# Patient Record
Sex: Male | Born: 1985 | Race: Black or African American | Hispanic: No | Marital: Single
Health system: Southern US, Community
[De-identification: ages and names within clinical notes are randomized; demographics above are authoritative.]

## PROBLEM LIST (undated history)

## (undated) DIAGNOSIS — I639 Cerebral infarction, unspecified: Secondary | ICD-10-CM

## (undated) HISTORY — PX: ABDOMINAL SURGERY: SHX537

## (undated) HISTORY — PX: CHEST SURGERY: SHX595

---
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
## 2006-09-06 IMAGING — CR DG CHEST 2V
2 series · 2 of 2 positions shown · non-contrast
Comparison: None.

CLINICAL DATA: Cough and fever. Body aches.  Hemoptysis.  

 CHEST (TWO VIEWS):

[w chest pa]
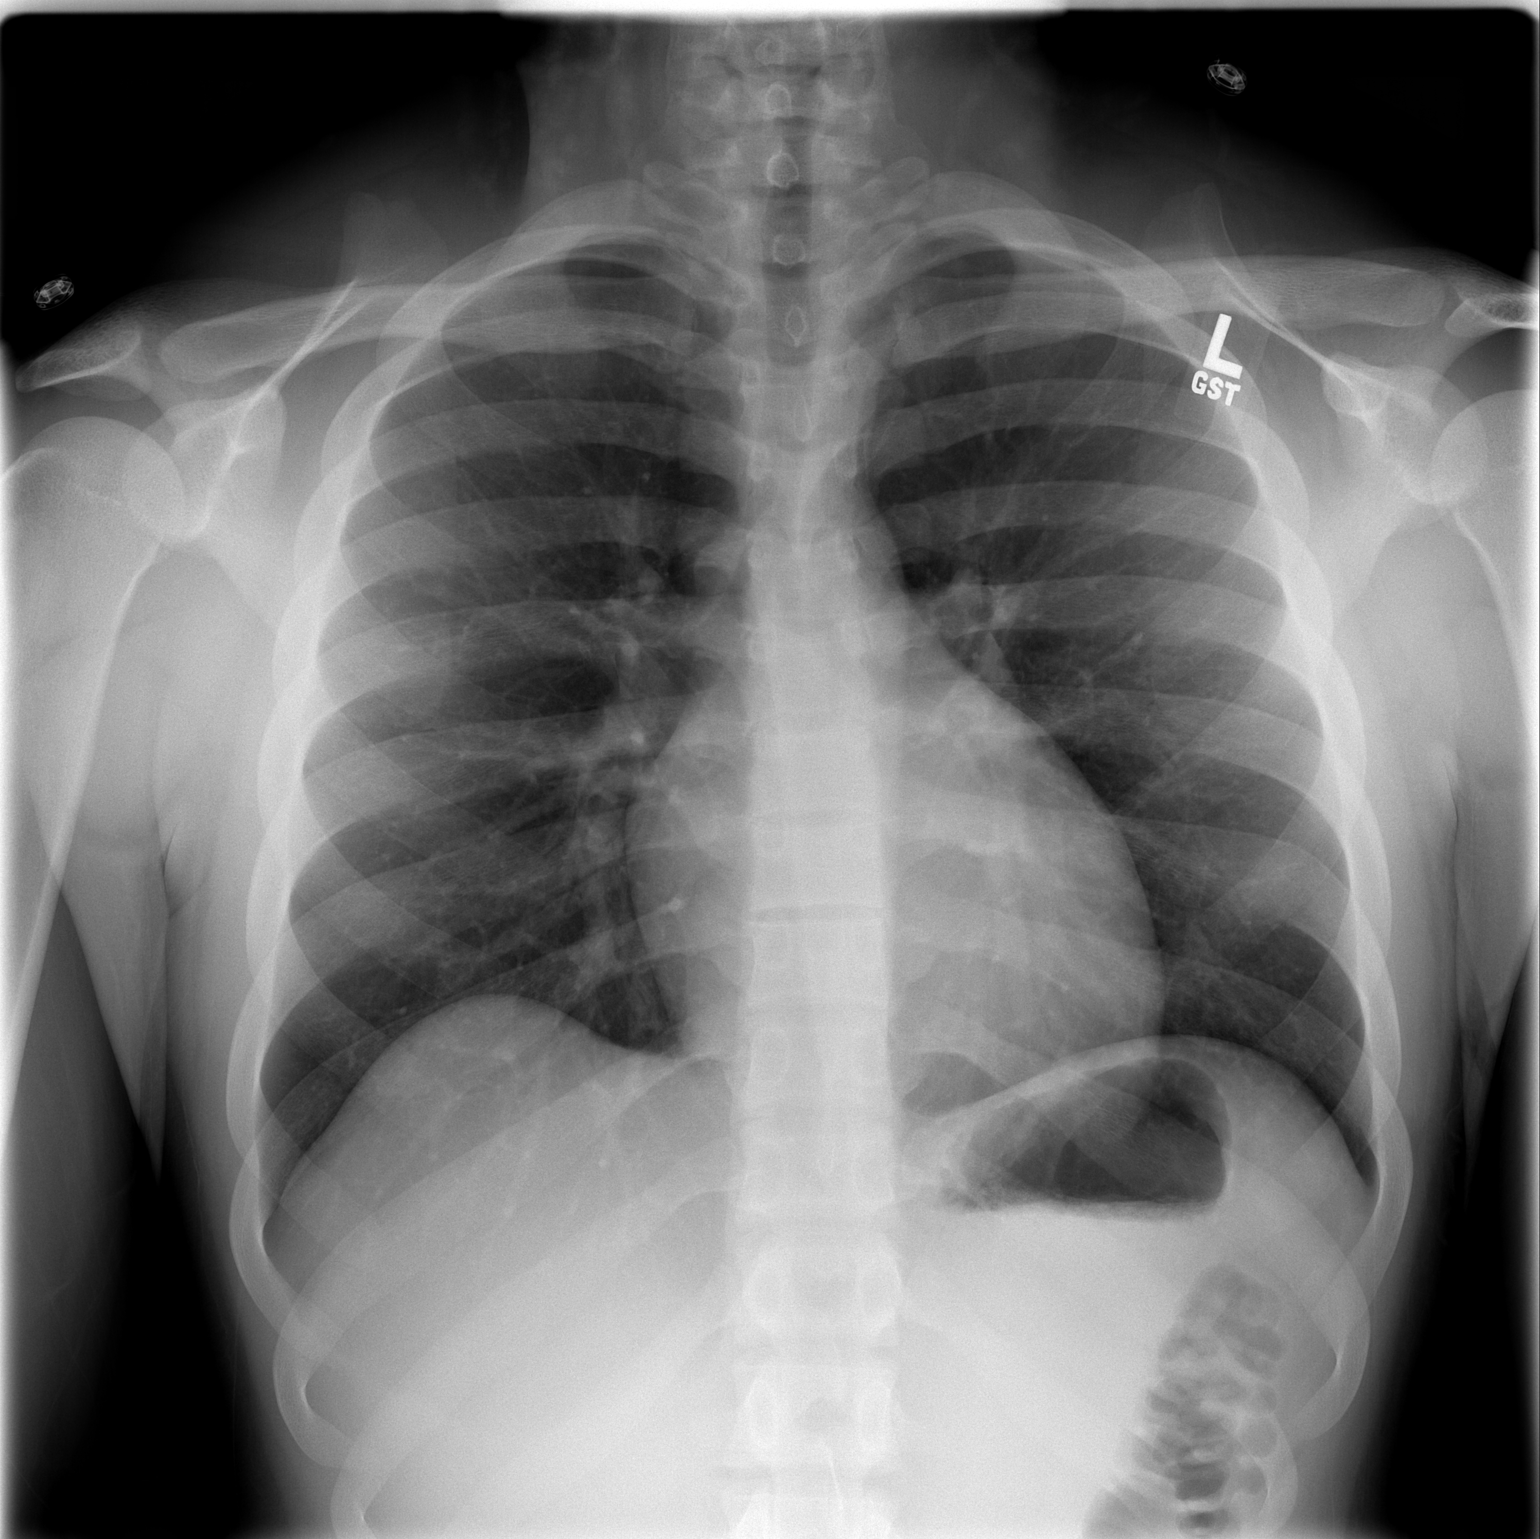

[w chest lat]
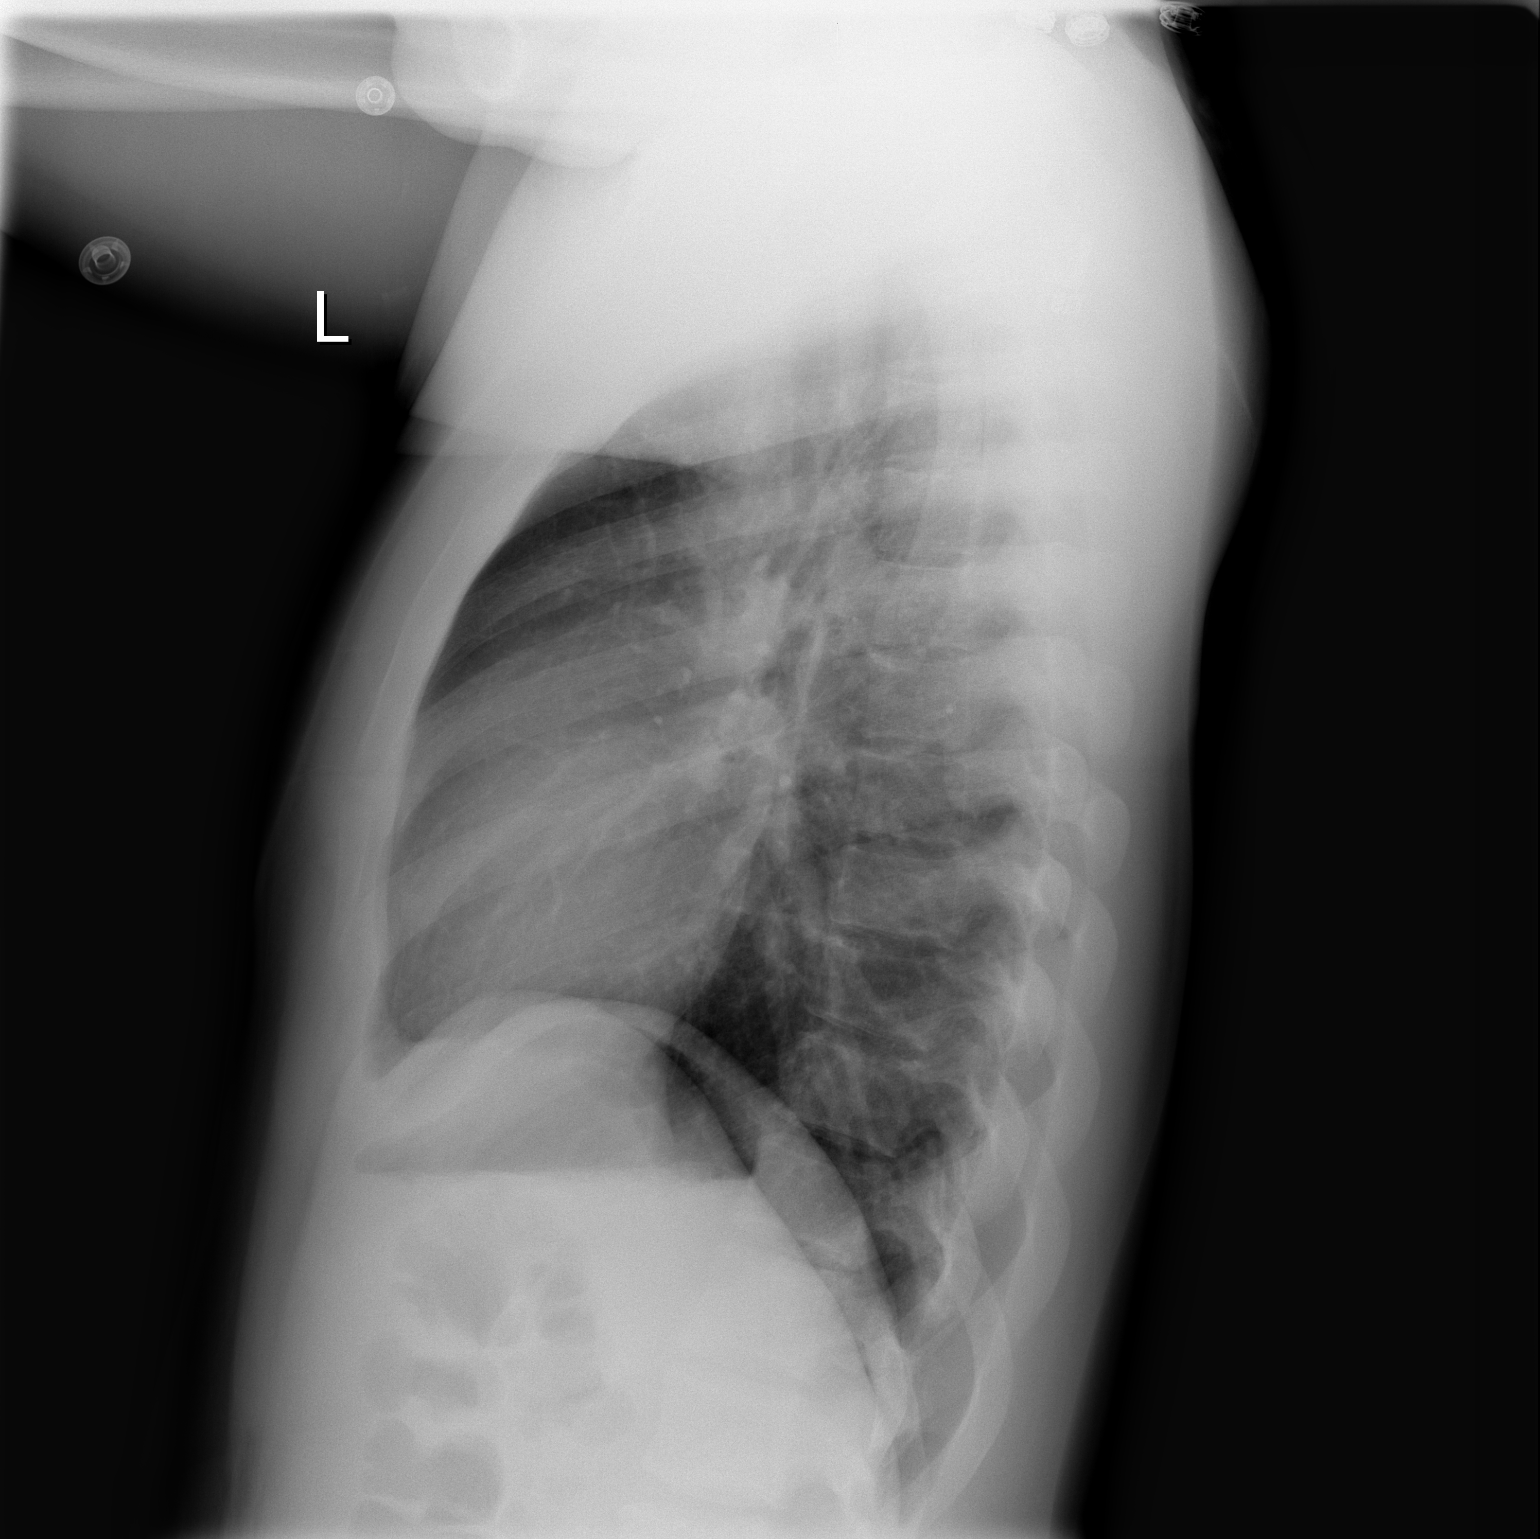

[2 of 2 positions shown; findings below may reference images not displayed]

The heart size and mediastinal contours are normal. The lungs are clear. The visualized skeleton is unremarkable.
IMPRESSION: No active disease.

## 2006-09-16 ENCOUNTER — Emergency Department (HOSPITAL_COMMUNITY): Admission: EM | Admit: 2006-09-16 | Discharge: 2006-09-17 | Payer: Self-pay | Admitting: Emergency Medicine

## 2017-10-21 ENCOUNTER — Encounter (HOSPITAL_BASED_OUTPATIENT_CLINIC_OR_DEPARTMENT_OTHER): Payer: Self-pay

## 2017-10-21 ENCOUNTER — Emergency Department (HOSPITAL_BASED_OUTPATIENT_CLINIC_OR_DEPARTMENT_OTHER)
Admission: EM | Admit: 2017-10-21 | Discharge: 2017-10-21 | Disposition: A | Discharge status: Home / Self Care | Payer: Self-pay | Attending: Emergency Medicine | Admitting: Emergency Medicine

## 2017-10-21 DIAGNOSIS — F17200 Nicotine dependence, unspecified, uncomplicated: Secondary | ICD-10-CM | POA: Insufficient documentation

## 2017-10-21 DIAGNOSIS — Z202 Contact with and (suspected) exposure to infections with a predominantly sexual mode of transmission: Secondary | ICD-10-CM | POA: Insufficient documentation

## 2017-10-21 LAB — URINALYSIS, ROUTINE W REFLEX MICROSCOPIC
Bilirubin Urine: NEGATIVE
Glucose, UA: NEGATIVE mg/dL
Ketones, ur: NEGATIVE mg/dL
Leukocytes, UA: NEGATIVE
Nitrite: NEGATIVE
Protein, ur: NEGATIVE mg/dL
Specific Gravity, Urine: 1.01 (ref 1.005–1.030)
pH: 6 (ref 5.0–8.0)

## 2017-10-21 LAB — URINALYSIS, MICROSCOPIC (REFLEX)

## 2017-10-21 MED ORDER — AZITHROMYCIN 250 MG PO TABS
1000.0000 mg | ORAL_TABLET | Freq: Once | ORAL | Status: AC
  Administered 2017-10-21: 1000 mg via ORAL
  Filled 2017-10-21: qty 4

## 2017-10-21 MED ORDER — AZITHROMYCIN 250 MG PO TABS: 250.0000 mg | ORAL_TABLET | Freq: Every day | ORAL | 0 refills | Status: DC

## 2017-10-21 MED ORDER — CEFTRIAXONE SODIUM 250 MG IJ SOLR
250.0000 mg | Freq: Once | INTRAMUSCULAR | Status: AC
  Administered 2017-10-21: 250 mg via INTRAMUSCULAR
  Filled 2017-10-21: qty 250

## 2017-10-21 MED FILL — AZITHROMYCIN 250 MG TABLET: 250 | 5 days supply | Qty: 6 | Fill #0
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
## 2019-08-26 ENCOUNTER — Emergency Department (HOSPITAL_BASED_OUTPATIENT_CLINIC_OR_DEPARTMENT_OTHER)
Admission: EM | Admit: 2019-08-26 | Discharge: 2019-08-26 | Disposition: A | Discharge status: Home / Self Care | Payer: HRSA Program | Attending: Emergency Medicine | Admitting: Emergency Medicine

## 2019-08-26 ENCOUNTER — Other Ambulatory Visit: Payer: Self-pay

## 2019-08-26 ENCOUNTER — Encounter (HOSPITAL_BASED_OUTPATIENT_CLINIC_OR_DEPARTMENT_OTHER): Payer: Self-pay | Admitting: Emergency Medicine

## 2019-08-26 DIAGNOSIS — Z8673 Personal history of transient ischemic attack (TIA), and cerebral infarction without residual deficits: Secondary | ICD-10-CM | POA: Insufficient documentation

## 2019-08-26 DIAGNOSIS — R059 Cough, unspecified: Secondary | ICD-10-CM

## 2019-08-26 DIAGNOSIS — R05 Cough: Secondary | ICD-10-CM | POA: Insufficient documentation

## 2019-08-26 DIAGNOSIS — Z20828 Contact with and (suspected) exposure to other viral communicable diseases: Secondary | ICD-10-CM | POA: Insufficient documentation

## 2019-08-26 DIAGNOSIS — J3489 Other specified disorders of nose and nasal sinuses: Secondary | ICD-10-CM | POA: Insufficient documentation

## 2019-08-26 HISTORY — DX: Cerebral infarction, unspecified: I63.9

## 2019-08-26 NOTE — ED Notes (Signed)
ED Provider at bedside. 

## 2019-08-26 NOTE — Discharge Instructions (Signed)
It was my pleasure taking care of you today!   Rest, drink plenty of fluids to be sure you are staying hydrated.   Please follow up with your primary doctor for discussion of your diagnoses and further evaluation after today's visit if symptoms persist longer than 7 days; Return to the ER for high fevers, difficulty breathing or other concerning symptoms

## 2019-08-26 NOTE — ED Triage Notes (Signed)
Cough and runny nose since yesterday.  Sts he felt hot last night but did not check temp.
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
## 2019-08-27 LAB — NOVEL CORONAVIRUS, NAA (HOSP ORDER, SEND-OUT TO REF LAB; TAT 18-24 HRS): SARS-CoV-2, NAA: NOT DETECTED

## 2023-01-25 ENCOUNTER — Other Ambulatory Visit: Payer: Self-pay

## 2023-01-25 ENCOUNTER — Encounter (HOSPITAL_COMMUNITY): Payer: Self-pay | Admitting: Emergency Medicine

## 2023-01-25 ENCOUNTER — Emergency Department (HOSPITAL_COMMUNITY)
Admission: EM | Admit: 2023-01-25 | Discharge: 2023-01-26 | Disposition: A | Discharge status: Home / Self Care | Payer: Medicaid Other | Attending: Emergency Medicine | Admitting: Emergency Medicine

## 2023-01-25 ENCOUNTER — Emergency Department (HOSPITAL_COMMUNITY): Payer: Medicaid Other

## 2023-01-25 DIAGNOSIS — S01111A Laceration without foreign body of right eyelid and periocular area, initial encounter: Secondary | ICD-10-CM

## 2023-01-25 DIAGNOSIS — S00201A Unspecified superficial injury of right eyelid and periocular area, initial encounter: Secondary | ICD-10-CM | POA: Diagnosis present

## 2023-01-25 DIAGNOSIS — X58XXXA Exposure to other specified factors, initial encounter: Secondary | ICD-10-CM | POA: Insufficient documentation

## 2023-01-25 DIAGNOSIS — Z79899 Other long term (current) drug therapy: Secondary | ICD-10-CM | POA: Insufficient documentation

## 2023-01-25 DIAGNOSIS — S01131A Puncture wound without foreign body of right eyelid and periocular area, initial encounter: Secondary | ICD-10-CM | POA: Insufficient documentation

## 2023-01-25 LAB — URINALYSIS, MICROSCOPIC (REFLEX): Bacteria, UA: NONE SEEN

## 2023-01-25 LAB — URINALYSIS, ROUTINE W REFLEX MICROSCOPIC
Bilirubin Urine: NEGATIVE
Glucose, UA: NEGATIVE mg/dL
Ketones, ur: NEGATIVE mg/dL
Leukocytes,Ua: NEGATIVE
Nitrite: NEGATIVE
Protein, ur: NEGATIVE mg/dL
Specific Gravity, Urine: >1.03 — ABNORMAL HIGH (ref 1.005–1.030)
pH: 5.5 (ref 5.0–8.0)

## 2023-01-25 LAB — I-STAT CHEM 8, ED
BUN: 18 mg/dL (ref 6–20)
Calcium, Ion: 1.03 mmol/L — ABNORMAL LOW (ref 1.15–1.40)
Chloride: 109 mmol/L (ref 98–111)
Creatinine, Ser: 1.5 mg/dL — ABNORMAL HIGH (ref 0.61–1.24)
Glucose, Bld: 91 mg/dL (ref 70–99)
HCT: 48 % (ref 39.0–52.0)
Hemoglobin: 16.3 g/dL (ref 13.0–17.0)
Potassium: 4.2 mmol/L (ref 3.5–5.1)
Sodium: 144 mmol/L (ref 135–145)
TCO2: 24 mmol/L (ref 22–32)

## 2023-01-25 LAB — COMPREHENSIVE METABOLIC PANEL
ALT: 19 U/L (ref 0–44)
AST: 38 U/L (ref 15–41)
Albumin: 3.7 g/dL (ref 3.5–5.0)
Alkaline Phosphatase: 60 U/L (ref 38–126)
Anion gap: 11 (ref 5–15)
BUN: 14 mg/dL (ref 6–20)
CO2: 22 mmol/L (ref 22–32)
Calcium: 8.6 mg/dL — ABNORMAL LOW (ref 8.9–10.3)
Chloride: 108 mmol/L (ref 98–111)
Creatinine, Ser: 1.24 mg/dL (ref 0.61–1.24)
GFR, Estimated: >60 mL/min (ref >60)
Glucose, Bld: 92 mg/dL (ref 70–99)
Potassium: 4.1 mmol/L (ref 3.5–5.1)
Sodium: 141 mmol/L (ref 135–145)
Total Bilirubin: 0.8 mg/dL (ref 0.3–1.2)
Total Protein: 6.5 g/dL (ref 6.5–8.1)

## 2023-01-25 LAB — CBC
HCT: 46.3 % (ref 39.0–52.0)
Hemoglobin: 15.7 g/dL (ref 13.0–17.0)
MCH: 30.8 pg (ref 26.0–34.0)
MCHC: 33.9 g/dL (ref 30.0–36.0)
MCV: 91 fL (ref 80.0–100.0)
Platelets: 222 10*3/uL (ref 150–400)
RBC: 5.09 MIL/uL (ref 4.22–5.81)
RDW: 14.5 % (ref 11.5–15.5)
WBC: 9.5 10*3/uL (ref 4.0–10.5)
nRBC: 0 % (ref 0.0–0.2)

## 2023-01-25 LAB — SAMPLE TO BLOOD BANK

## 2023-01-25 LAB — PROTIME-INR
INR: 1 (ref 0.8–1.2)
Prothrombin Time: 12.7 seconds (ref 11.4–15.2)

## 2023-01-25 LAB — ETHANOL: Alcohol, Ethyl (B): 194 mg/dL — ABNORMAL HIGH (ref <10)

## 2023-01-25 LAB — LACTIC ACID, PLASMA: Lactic Acid, Venous: 1.9 mmol/L (ref 0.5–1.9)

## 2023-01-25 MED ORDER — LIDOCAINE-EPINEPHRINE (PF) 2 %-1:200000 IJ SOLN
INTRAMUSCULAR | Status: AC
  Filled 2023-01-25: qty 20

## 2023-01-25 MED ORDER — PIPERACILLIN-TAZOBACTAM 3.375 G IVPB 30 MIN
3.3750 g | Freq: Once | INTRAVENOUS | Status: AC
  Administered 2023-01-25: 3.375 g via INTRAVENOUS

## 2023-01-25 MED ORDER — LORAZEPAM 2 MG/ML IJ SOLN
1.0000 mg | Freq: Once | INTRAMUSCULAR | Status: AC
  Administered 2023-01-25: 1 mg via INTRAVENOUS
  Filled 2023-01-25: qty 1
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
## 2023-01-25 NOTE — Progress Notes (Signed)
Orthopedic Tech Progress Note Patient Details:  Randall Stewart 03/09/1986 [id number]  Patient ID: Randall Stewart, male   DOB: [birth year], 37 y.o.   MRN: [id number] I attended trauma page. Karolee Stamps 01/25/2023, 9:42 PM
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
## 2023-01-25 NOTE — ED Notes (Signed)
Ophthalmology at bedside to attempt closure of R eye. Patient is uncooperative at this time and is thrashing around in bed while MD is trying to do eye exam.
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
## 2023-01-25 NOTE — ED Notes (Signed)
Ophthalmology MD at bedside to evaluate patient

## 2023-01-25 NOTE — ED Notes (Signed)
Verbal order from [place], MD to give other half of Ativan

## 2023-01-26 MED ORDER — CEPHALEXIN 500 MG PO CAPS
500.0000 mg | ORAL_CAPSULE | Freq: Four times a day (QID) | ORAL | 0 refills | Status: DC
  Filled 2023-01-26: qty 20, 5d supply, fill #0

## 2023-01-26 MED ORDER — CEPHALEXIN 500 MG PO CAPS: 500.0000 mg | ORAL_CAPSULE | Freq: Four times a day (QID) | ORAL | 0 refills | Status: AC

## 2023-01-26 MED ORDER — HYDROCODONE-ACETAMINOPHEN 5-325 MG PO TABS: 1.0000 | ORAL_TABLET | Freq: Four times a day (QID) | ORAL | 0 refills | Status: AC | PRN

## 2023-01-26 MED ORDER — HYDROCODONE-ACETAMINOPHEN 5-325 MG PO TABS
1.0000 | ORAL_TABLET | Freq: Once | ORAL | Status: AC
  Administered 2023-01-26: 1 via ORAL
  Filled 2023-01-26: qty 1

## 2023-01-26 MED ORDER — ERYTHROMYCIN 5 MG/GM OP OINT
1.0000 | TOPICAL_OINTMENT | Freq: Three times a day (TID) | OPHTHALMIC | Status: DC
  Administered 2023-01-26: 1 via OPHTHALMIC
  Filled 2023-01-26: qty 3.5
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
## 2023-01-27 ENCOUNTER — Other Ambulatory Visit: Payer: Self-pay

## 2023-02-08 ENCOUNTER — Other Ambulatory Visit: Payer: Self-pay

## 2023-02-08 ENCOUNTER — Encounter (HOSPITAL_BASED_OUTPATIENT_CLINIC_OR_DEPARTMENT_OTHER): Payer: Self-pay | Admitting: Emergency Medicine

## 2023-02-08 ENCOUNTER — Emergency Department (HOSPITAL_BASED_OUTPATIENT_CLINIC_OR_DEPARTMENT_OTHER)
Admission: EM | Admit: 2023-02-08 | Discharge: 2023-02-08 | Discharge status: Against Medical Advice | Payer: Medicaid Other | Attending: Emergency Medicine | Admitting: Emergency Medicine

## 2023-02-08 DIAGNOSIS — K649 Unspecified hemorrhoids: Secondary | ICD-10-CM | POA: Diagnosis present

## 2023-02-08 DIAGNOSIS — Z5321 Procedure and treatment not carried out due to patient leaving prior to being seen by health care provider: Secondary | ICD-10-CM | POA: Diagnosis not present

## 2023-02-08 NOTE — ED Triage Notes (Signed)
Pt c/o rectal pain; sts possibly hemorrhoids

## 2023-02-08 NOTE — ED Provider Notes (Signed)
Patient has eloped.   Varney Biles, MD 02/08/23 2223

## 2023-02-08 NOTE — ED Provider Triage Note (Signed)
Emergency Medicine Provider Triage Evaluation Note  Randall Stewart , a 37 y.o. male  was evaluated in triage.  Pt complains of rectal pain for the past few days, but worse last night. He reports that he couldn't go to sleep last night because of the pain. Reports that he feels like it is "inside". No black of bloody stools. Does not think he is constipated. No medication trialed.  Review of Systems  Positive:  Negative:   Physical Exam  BP 104/72   Pulse 82   Temp (!) 97.5 F (36.4 C) (Oral)   Resp 16   SpO2 97%  Gen:   Awake, no distress   Resp:  Normal effort  MSK:   Moves extremities without difficulty  Other:    Medical Decision Making  Medically screening exam initiated at 3:29 PM.  Appropriate orders placed.  Randall Stewart was informed that the remainder of the evaluation will be completed by another provider, this initial triage assessment does not replace that evaluation, and the importance of remaining in the ED until their evaluation is complete.     Sherrell Puller, [place] 02/08/23 1530

## 2023-09-12 ENCOUNTER — Encounter (HOSPITAL_COMMUNITY): Payer: Self-pay | Admitting: Emergency Medicine

## 2023-09-12 ENCOUNTER — Ambulatory Visit (HOSPITAL_COMMUNITY)
Admission: EM | Admit: 2023-09-12 | Discharge: 2023-09-12 | Disposition: A | Discharge status: Home / Self Care | Payer: 59 | Attending: Emergency Medicine | Admitting: Emergency Medicine

## 2023-09-12 DIAGNOSIS — B353 Tinea pedis: Secondary | ICD-10-CM | POA: Diagnosis not present

## 2023-09-12 DIAGNOSIS — B351 Tinea unguium: Secondary | ICD-10-CM

## 2023-09-12 DIAGNOSIS — R609 Edema, unspecified: Secondary | ICD-10-CM | POA: Diagnosis not present

## 2023-09-12 MED ORDER — EFINACONAZOLE 10 % EX SOLN: 1.0000 | Freq: Every day | CUTANEOUS | 10 refills | Status: AC

## 2023-09-12 MED ORDER — CLOTRIMAZOLE 1 % EX CREA: TOPICAL_CREAM | CUTANEOUS | 0 refills | Status: AC
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
## 2023-09-12 NOTE — ED Triage Notes (Signed)
Pt c/o of both feet swelling and redness that he noticed last night. States there is no pain.

## 2023-09-12 NOTE — Discharge Instructions (Signed)
For your edema of your ankles you can wear compression socks daily to help with your venous return.  Please elevate your feet when at rest and at home.  You have a fungal infection between your toes and on your toenails.  Apply the topical efinaconazole solution daily until resolution, this can take up to 48 weeks.  Use the Lotrimin cream in between your toes twice daily to help with the fungal infection there as well.  You can use cotton socks and change them frequently or no socks.  Return to clinic for any new or urgent symptoms.

## 2023-10-07 ENCOUNTER — Emergency Department (HOSPITAL_COMMUNITY)
Admission: EM | Admit: 2023-10-07 | Discharge: 2023-10-08 | Disposition: A | Discharge status: Home / Self Care | Payer: 59 | Attending: Emergency Medicine | Admitting: Emergency Medicine

## 2023-10-07 ENCOUNTER — Emergency Department (HOSPITAL_COMMUNITY): Payer: 59

## 2023-10-07 ENCOUNTER — Other Ambulatory Visit: Payer: Self-pay

## 2023-10-07 DIAGNOSIS — W228XXA Striking against or struck by other objects, initial encounter: Secondary | ICD-10-CM | POA: Insufficient documentation

## 2023-10-07 DIAGNOSIS — Y9301 Activity, walking, marching and hiking: Secondary | ICD-10-CM | POA: Insufficient documentation

## 2023-10-07 DIAGNOSIS — Y990 Civilian activity done for income or pay: Secondary | ICD-10-CM | POA: Diagnosis not present

## 2023-10-07 DIAGNOSIS — S0003XA Contusion of scalp, initial encounter: Secondary | ICD-10-CM | POA: Diagnosis not present

## 2023-10-07 DIAGNOSIS — I517 Cardiomegaly: Secondary | ICD-10-CM | POA: Diagnosis not present

## 2023-10-07 DIAGNOSIS — Z7982 Long term (current) use of aspirin: Secondary | ICD-10-CM | POA: Insufficient documentation

## 2023-10-07 DIAGNOSIS — R051 Acute cough: Secondary | ICD-10-CM | POA: Diagnosis not present

## 2023-10-07 DIAGNOSIS — R059 Cough, unspecified: Secondary | ICD-10-CM | POA: Diagnosis not present

## 2023-10-07 DIAGNOSIS — S0001XA Abrasion of scalp, initial encounter: Secondary | ICD-10-CM | POA: Diagnosis not present

## 2023-10-07 DIAGNOSIS — S0990XA Unspecified injury of head, initial encounter: Secondary | ICD-10-CM | POA: Diagnosis not present

## 2023-10-07 MED ORDER — ACETAMINOPHEN 325 MG PO TABS
650.0000 mg | ORAL_TABLET | Freq: Once | ORAL | Status: AC
  Administered 2023-10-07: 650 mg via ORAL
  Filled 2023-10-07: qty 2

## 2023-10-07 NOTE — ED Triage Notes (Addendum)
Pt to ED POV from home. Pt states he was changing the breaks on his car yesterday and when he stood up, he hit the top of his head on the back of his car. Pt has abrasion to top of head. Pt denies any blood thinners other than baby aspirin. Pt denies any LOC. Pt endorses headache. Pt c/o mild dizziness while walking. Pt also c/o cough x2 weeks. Pt denies any pain or any fevers from cough. Pt has hx of stroke in 2019 and c/o no new deficits from hitting top of his head on car.

## 2023-10-08 NOTE — ED Provider Notes (Signed)
[place] [facility] AT [facility] Provider Note  CSN: [id number] Arrival date & time: 10/07/23 1839  Chief Complaint(s) Head Injury  HPI Randall Stewart is a 37 y.o. male here for frontal head injury that occurred yesterday.  Patient reports that he was working on a car when he stood up and scraped his scalp.  He has been having scalp pain since.  Patient has an abrasion to the frontal scalp region.  States that he cleaned it.  No focal deficits.  No visual disturbance.  No nausea or vomiting.  Patient does report cough for the past 2 weeks.Marland Kitchen  No chest pain.  No shortness of breath.  The history is provided by the patient.    Past Medical History Past Medical History:  Diagnosis Date   Stroke [facility])    There are no problems to display for this patient.  Home Medication(s) Prior to Admission medications   Medication Sig Start Date End Date Taking? Authorizing Provider  aspirin 81 MG chewable tablet  08/01/19   [provider]  cephALEXin (KEFLEX) 500 MG capsule Take 1 capsule (500 mg total) by mouth 4 (four) times daily. Patient not taking: Reported on 09/12/2023 01/26/23   Zadie Rhine, MD  clotrimazole (LOTRIMIN) 1 % cream Apply to affected area 2 times daily 09/12/23   Garrison, [place] N, FNP  Efinaconazole 10 % SOLN Apply 1 Application topically daily. 09/12/23   Garrison, [place] N, FNP  HYDROcodone-acetaminophen (NORCO/VICODIN) 5-325 MG tablet Take 1 tablet by mouth every 6 (six) hours as needed for severe pain. Patient not taking: Reported on 09/12/2023 01/26/23   Zadie Rhine, MD  traMADol Janean Sark) 50 MG tablet  04/15/19   [provider]                                                                                                                                    Allergies Patient has no known allergies.  Review of Systems Review of Systems As noted in HPI  Physical Exam Vital Signs  I have reviewed the triage vital  signs BP (!) 126/93   Pulse (!) 54   Temp 98 F (36.7 C) (Oral)   Resp 18   SpO2 98%   Physical Exam Vitals reviewed.  Constitutional:      General: He is not in acute distress.    Appearance: He is well-developed. He is not diaphoretic.  HENT:     Head: Normocephalic. Abrasion present.      Comments: Right eye ptosis (baseline from prior trauma)    Right Ear: External ear normal.     Left Ear: External ear normal.     Nose: Nose normal.     Mouth/Throat:     Mouth: Mucous membranes are moist.  Eyes:     General: No scleral icterus.    Conjunctiva/sclera: Conjunctivae normal.  Neck:     Trachea: Phonation normal.  Cardiovascular:  Rate and Rhythm: Normal rate and regular rhythm.  Pulmonary:     Effort: Pulmonary effort is normal. No respiratory distress.     Breath sounds: No stridor.  Abdominal:     General: There is no distension.  Musculoskeletal:        General: Normal range of motion.     Cervical back: Normal range of motion.  Neurological:     Mental Status: He is alert and oriented to person, place, and time.  Psychiatric:        Behavior: Behavior normal.     ED Results and Treatments Labs (all labs ordered are listed, but only abnormal results are displayed) Labs Reviewed - No data to display                                                                                                                       EKG  EKG Interpretation Date/Time:    Ventricular Rate:    PR Interval:    QRS Duration:    QT Interval:    QTC Calculation:   R Axis:      Text Interpretation:         Radiology DG Chest 2 View  Result Date: 10/08/2023 CLINICAL DATA:  Cough EXAM: CHEST - 2 VIEW COMPARISON:  02/13/2005, 11/26/2018 FINDINGS: Sternotomy changes. Borderline cardiac enlargement. No acute airspace disease, pleural effusion, or pneumothorax. IMPRESSION: No active cardiopulmonary disease. Borderline cardiac enlargement. Electronically Signed   By: Jasmine Pang M.D.   On: 10/08/2023 00:08    Medications Ordered in ED Medications  acetaminophen (TYLENOL) tablet 650 mg (650 mg Oral Given 10/07/23 2204)   Procedures Procedures  (including critical care time) Medical Decision Making / ED Course   Medical Decision Making Amount and/or Complexity of Data Reviewed Radiology: ordered and independent interpretation performed. Decision-making details documented in ED Course.    Minor head injury more than 24 hours ago.  Patient has frontal scalp abrasion that is well-appearing.  No evidence of superimposed infection.  Doubt ICH requiring imaging at this time. Chest x-ray negative for pneumonia, pneumothorax, pulmonary edema pleural effusion.  Cough likely seasonal allergies versus bronchitis.    Final Clinical Impression(s) / ED Diagnoses Final diagnoses:  Contusion of scalp, initial encounter  Abrasion of scalp, initial encounter  Acute cough   The patient appears reasonably screened and/or stabilized for discharge and I doubt any other medical condition or other [facility] requiring further screening, evaluation, or treatment in the ED at this time. I have discussed the findings, Dx and Tx plan with the patient/family who expressed understanding and agree(s) with the plan. Discharge instructions discussed at length. The patient/family was given strict return precautions who verbalized understanding of the instructions. No further questions at time of discharge.  Disposition: Discharge  Condition: Good  ED Discharge Orders     None       Follow Up: Primary care provider  Call  to schedule an appointment for close follow up  This chart was dictated using voice recognition software.  Despite best efforts to proofread,  errors can occur which can change the documentation meaning.    Nira Conn, MD 10/08/23 [phone]

## 2024-03-10 ENCOUNTER — Encounter (HOSPITAL_BASED_OUTPATIENT_CLINIC_OR_DEPARTMENT_OTHER): Payer: Self-pay

## 2024-03-10 ENCOUNTER — Other Ambulatory Visit: Payer: Self-pay

## 2024-03-10 ENCOUNTER — Emergency Department (HOSPITAL_BASED_OUTPATIENT_CLINIC_OR_DEPARTMENT_OTHER)
Admission: EM | Admit: 2024-03-10 | Discharge: 2024-03-10 | Disposition: A | Discharge status: Home / Self Care | Attending: Emergency Medicine | Admitting: Emergency Medicine

## 2024-03-10 DIAGNOSIS — M722 Plantar fascial fibromatosis: Secondary | ICD-10-CM | POA: Diagnosis not present

## 2024-03-10 DIAGNOSIS — B353 Tinea pedis: Secondary | ICD-10-CM | POA: Diagnosis not present

## 2024-03-10 DIAGNOSIS — M79671 Pain in right foot: Secondary | ICD-10-CM | POA: Diagnosis present

## 2024-03-10 DIAGNOSIS — Z7982 Long term (current) use of aspirin: Secondary | ICD-10-CM | POA: Insufficient documentation

## 2024-03-10 NOTE — ED Triage Notes (Signed)
 Pt reports bilateral foot pain for 3 weeks. Pain worse when he gets up and walks in the morning. Pressure causes increase  pain. Pt denies any swelling. Also has firm non painful knot on right lower inner leg. No pain at this time

## 2024-03-10 NOTE — ED Provider Notes (Signed)
 Randall Stewart   CSN: [id number] Arrival date & time: 03/10/24  1733     History  Chief Complaint  Patient presents with   Foot Pain    Randall Stewart is a 38 y.o. male.  38 yo M with a chief complaints of bilateral foot pain and a nodule to his right lower leg.  The nodules been there for a few weeks now.  He is worried he might have a blood clot in his leg.  The pain in the feet is also been going on for a few weeks.  Worse first thing in the morning.  Hurts to the bottom part of the foot.  Painful with walking.  Denies trauma.  Denies fevers.   Foot Pain       Home Medications Prior to Admission medications   Medication Sig Start Date End Date Taking? Authorizing Provider  aspirin 81 MG chewable tablet  08/01/19  Yes [provider]  cephALEXin (KEFLEX) 500 MG capsule Take 1 capsule (500 mg total) by mouth 4 (four) times daily. Patient not taking: Reported on 09/12/2023 01/26/23   Zadie Rhine, MD  clotrimazole (LOTRIMIN) 1 % cream Apply to affected area 2 times daily 09/12/23   Garrison, [place] N, FNP  Efinaconazole 10 % SOLN Apply 1 Application topically daily. 09/12/23   Garrison, [place] N, FNP  HYDROcodone-acetaminophen (NORCO/VICODIN) 5-325 MG tablet Take 1 tablet by mouth every 6 (six) hours as needed for severe pain. Patient not taking: Reported on 09/12/2023 01/26/23   Zadie Rhine, MD  traMADol Janean Sark) 50 MG tablet  04/15/19   [provider]      Allergies    Patient has no known allergies.    Review of Systems   Review of Systems  Physical Exam Updated Vital Signs BP 132/85   Pulse 78   Temp 98.3 F (36.8 C) (Oral)   Resp 18   Wt 108 kg   SpO2 98%   BMI 33.19 kg/m  Physical Exam Vitals and nursing Stewart reviewed.  Constitutional:      Appearance: He is well-developed.  HENT:     Head: Normocephalic and atraumatic.  Eyes:     Pupils: Pupils are equal, round, and  reactive to light.  Neck:     Vascular: No JVD.  Cardiovascular:     Rate and Rhythm: Normal rate and regular rhythm.     Heart sounds: No murmur heard.    No friction rub. No gallop.  Pulmonary:     Effort: No respiratory distress.     Breath sounds: No wheezing.  Abdominal:     General: There is no distension.     Tenderness: There is no abdominal tenderness. There is no guarding or rebound.  Musculoskeletal:        General: Normal range of motion.     Cervical back: Normal range of motion and neck supple.     Comments: Some signs of athlete's foot to the bottom of his feet.  He does have diffuse tenderness about the sole of the foot bilaterally.  He does have a small painful nodule to the right inner lower leg.  I do not appreciate any erythema or warmth.  There is no induration.  There is perhaps a small amount of fluctuance.  Skin:    Coloration: Skin is not pale.     Findings: No rash.  Neurological:     Mental Status: He is alert and oriented  to person, place, and time.  Psychiatric:        Behavior: Behavior normal.     ED Results / Procedures / Treatments   Labs (all labs ordered are listed, but only abnormal results are displayed) Labs Reviewed - No data to display  EKG None  Radiology No results found.  Procedures Procedures    Medications Ordered in ED Medications - No data to display  ED Course/ Medical Decision Making/ A&P                                 Medical Decision Making  38 yo M with a chief complaint of bilateral foot pain and a nodule to his right lower leg.  The pain is most consistent with plantar fasciitis bilaterally.  I discussed supportive care for him at home.  I am not sure the exact etiology of the nodule to his leg.  It could be a abscess it has been there for a long time that is not fully healed.  Is not erythematous it is not warm there is no surrounding induration.  I do not feel that he would benefit from I&D.  He is  already on antibiotics for a wound to his left forearm.  Will have him follow-up with his doctor in the office.  Given information for podiatry if he so chooses.  7:37 PM:  I have discussed the diagnosis/risks/treatment options with the patient.  Evaluation and diagnostic testing in the emergency department does not suggest an emergent condition requiring admission or immediate intervention beyond what has been performed at this time.  They will follow up with PCP, podiatry. We also discussed returning to the ED immediately if new or worsening sx occur. We discussed the sx which are most concerning (e.g., sudden worsening pain, fever, inability to tolerate by mouth) that necessitate immediate return. Medications administered to the patient during their visit and any new prescriptions provided to the patient are listed below.  Medications given during this visit Medications - No data to display   The patient appears reasonably screen and/or stabilized for discharge and I doubt any other medical condition or other [facility] requiring further screening, evaluation, or treatment in the ED at this time prior to discharge.          Final Clinical Impression(s) / ED Diagnoses Final diagnoses:  Plantar fasciitis of left foot  Plantar fasciitis of right foot  Tinea pedis of both feet    Rx / DC Orders ED Discharge Orders     None         Melene Plan, DO 03/10/24 [id number]

## 2024-03-10 NOTE — Discharge Instructions (Signed)
 I think you have plantar fasciitis.  This tends to get better with supportive footwear.  Medicines like Tylenol and ibuprofen or naproxen tend to help with that as well.  I think the lesion on your leg should get better on its own.  You can try the antibiotic that was prescribed for your skin.  I think that may help as well.  I would also have you treat yourself for athlete's foot.  Take 4 over the counter ibuprofen tablets 3 times a day or 2 over-the-counter naproxen tablets twice a day for pain. Also take tylenol 1000mg (2 extra strength) four times a day.
